# Patient Record
Sex: Female | Born: 1955 | Race: White | Hispanic: No | Marital: Married | Smoking: Current every day smoker
Health system: Southern US, Community
[De-identification: ages and names within clinical notes are randomized; demographics above are authoritative.]

## PROBLEM LIST (undated history)

## (undated) DIAGNOSIS — I10 Essential (primary) hypertension: Secondary | ICD-10-CM

## (undated) DIAGNOSIS — E785 Hyperlipidemia, unspecified: Secondary | ICD-10-CM

## (undated) HISTORY — PX: KIDNEY SURGERY: SHX687

---

## 2004-04-09 ENCOUNTER — Emergency Department (HOSPITAL_COMMUNITY): Admission: EM | Admit: 2004-04-09 | Discharge: 2004-04-09 | Payer: Self-pay | Admitting: Emergency Medicine

## 2005-09-18 IMAGING — CT CT PELVIS W/O CM
1 series · 16 of 32 positions shown, 20 images · non-contrast
Comparison: none

CLINICAL DATA: Left flank pain radiating to left lower quadrant.  Evaluate for possible kidney stone.  
 CT ABDOMEN AND PELVIS WITHOUT CONTRAST
 The unenhanced appearance of the liver, spleen, pancreas and right kidney are all normal.  The left kidney has been removed due to a donor situation.  No adenopathy is seen.  Gallbladder is unremarkable.  Mild diverticula formation could be seen in the transverse colon without diverticulitis.  
 IMPRESSION
 1.  Status post left nephrectomy without adverse features.  
 2.  Diverticulosis without diverticulitis. 
 CT PELVIS WITHOUT CONTRAST 
 Contrast remains from the previous CT two days ago.  There is diverticulosis without diverticulitis.  No free fluid is seen.  There are no pelvic masses.  There is no evidence for obstructive uropathy.  Osseous structures are unremarkable.  
 1.  Residual contrast in the colon demonstrates diverticulosis without inflammatory change to suggest diverticulitis.

[Series 2: routine abdomen · axial · 0.95mm/px · z∈[-496,-166]mm · 16 of 74 slices shown, 20 images]
[im 5/74  soft-tissue]
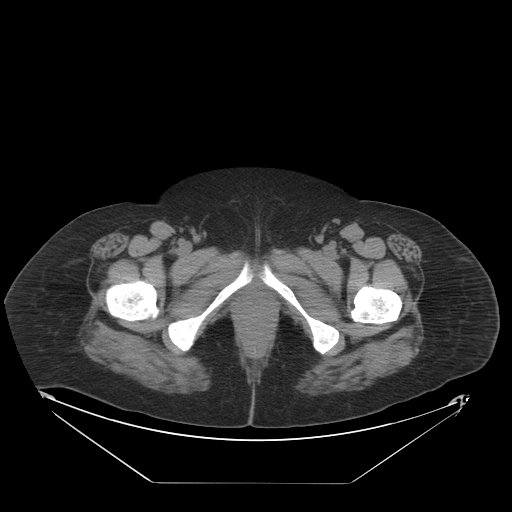
[im 5/74  bone]
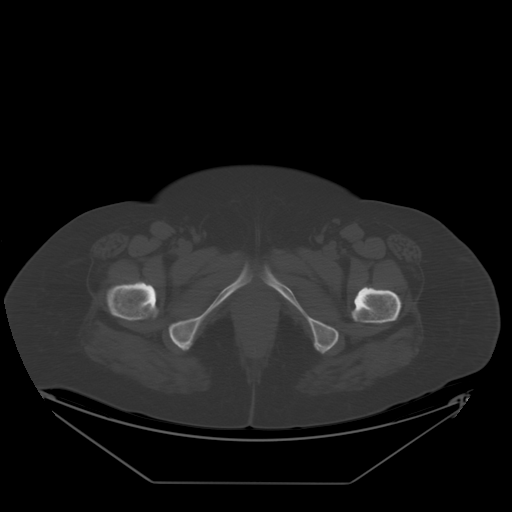
[im 10/74  soft-tissue]
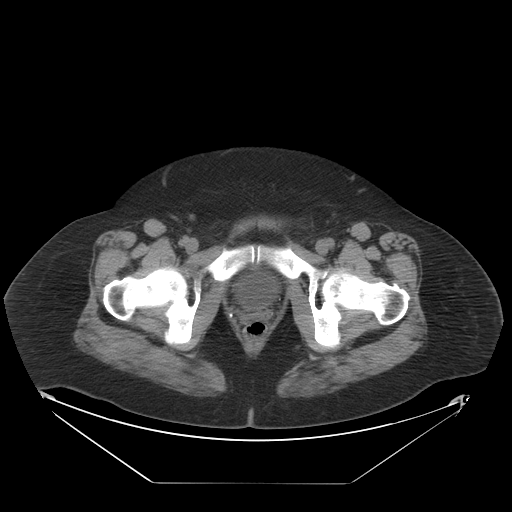
[im 15/74  soft-tissue]
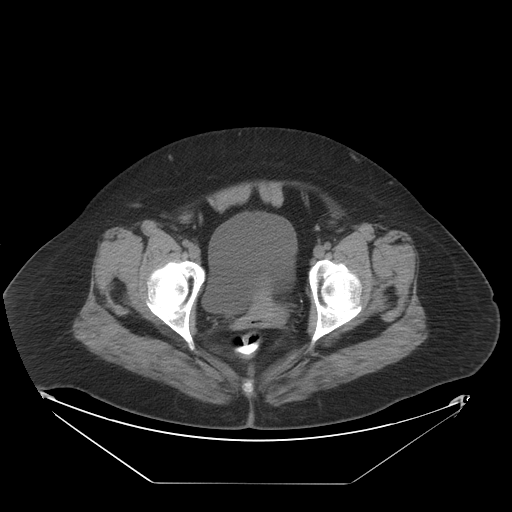
[im 19/74  soft-tissue]
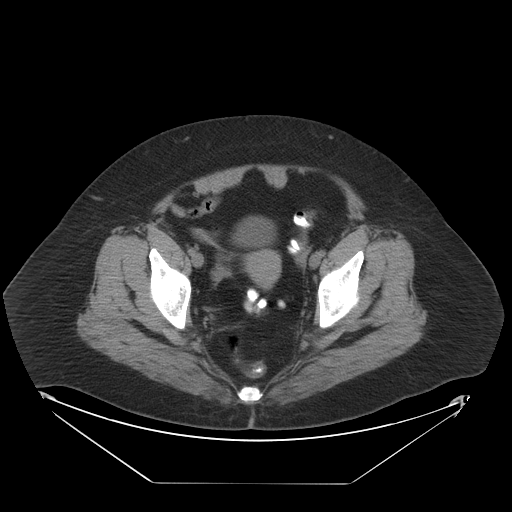
[im 24/74  soft-tissue]
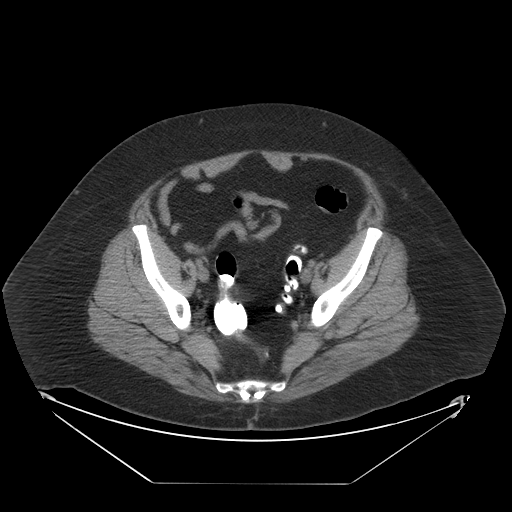
[im 29/74  soft-tissue]
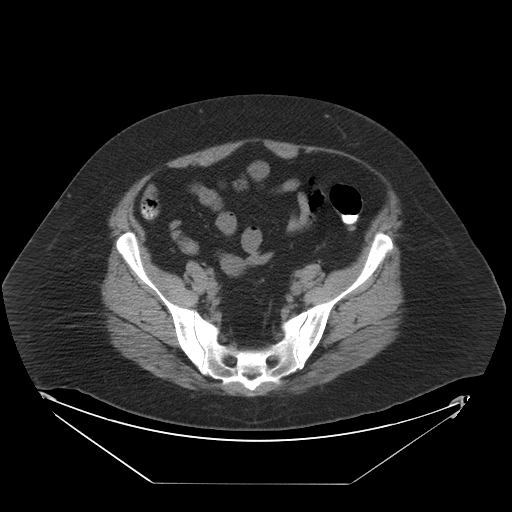
[im 33/74  soft-tissue]
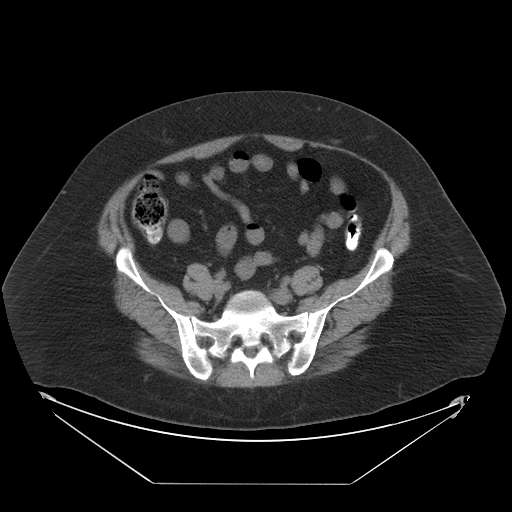
[im 41/74  soft-tissue]
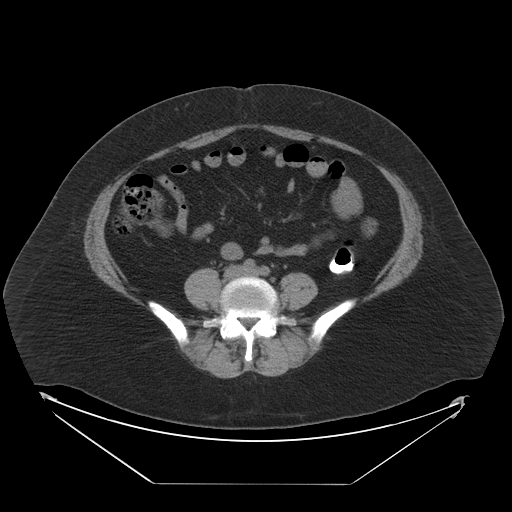
[im 45/74  soft-tissue]
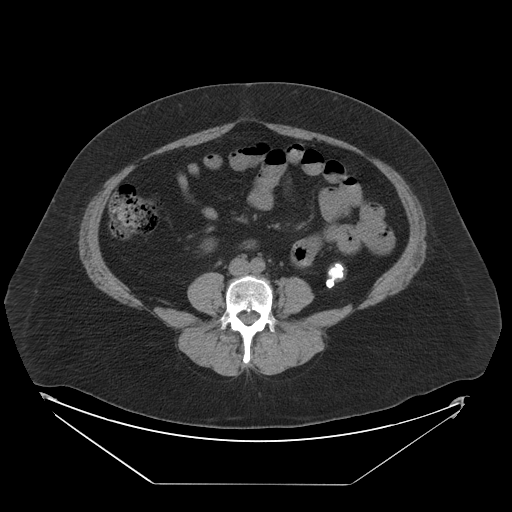
[im 45/74  bone]
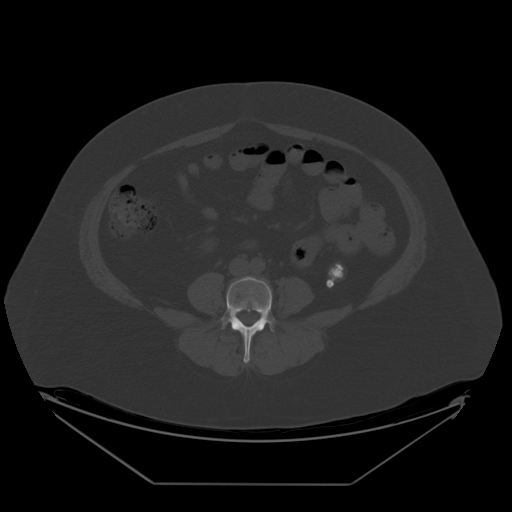
[im 50/74  soft-tissue]
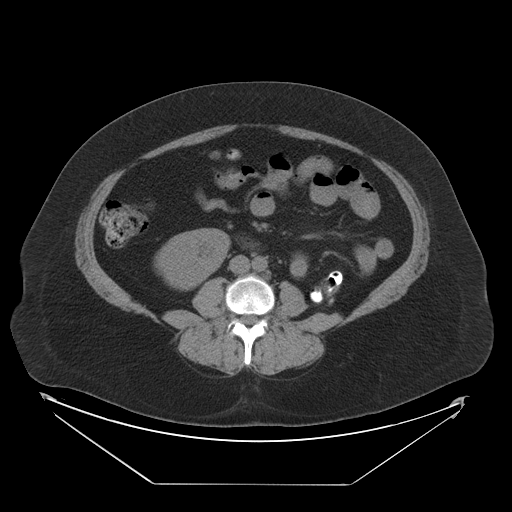
[im 55/74  soft-tissue]
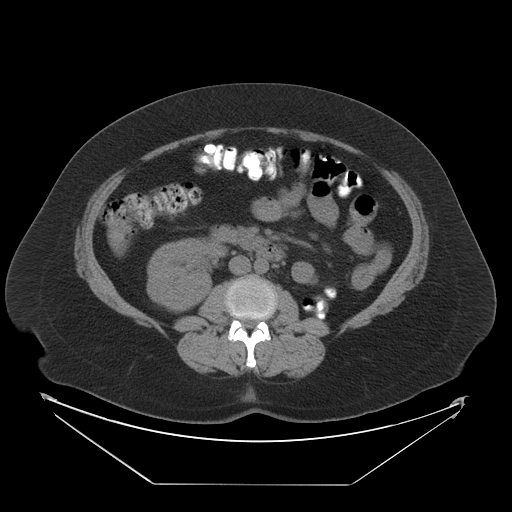
[im 59/74  soft-tissue]
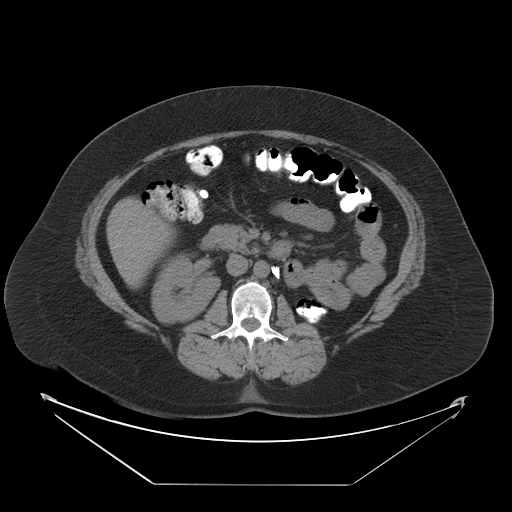
[im 64/74  soft-tissue]
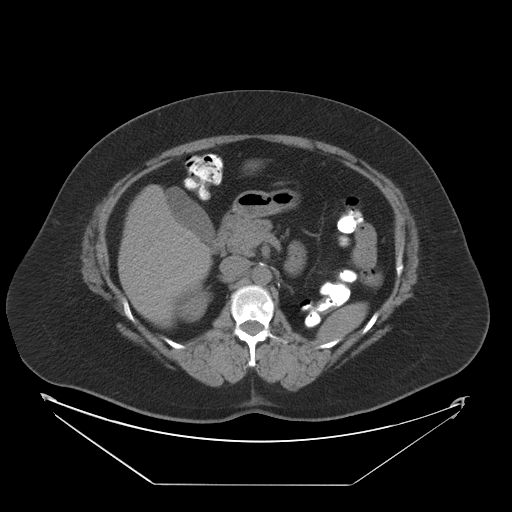
[im 64/74  lung]
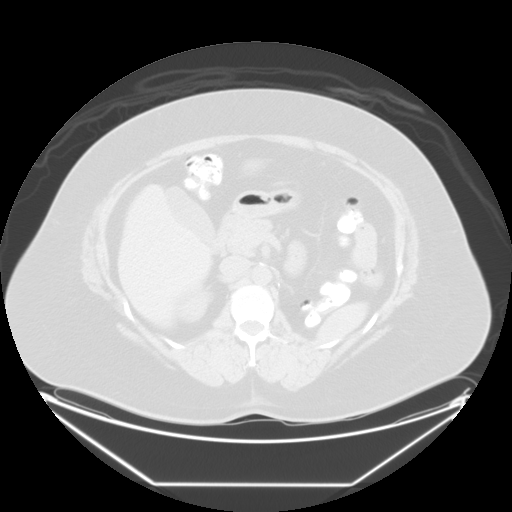
[im 66/74  lung]
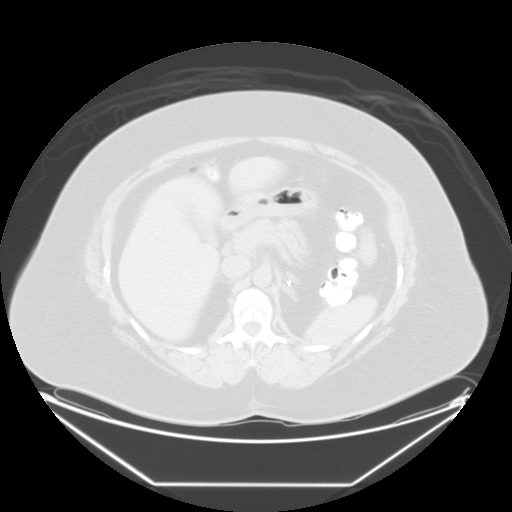
[im 69/74  soft-tissue]
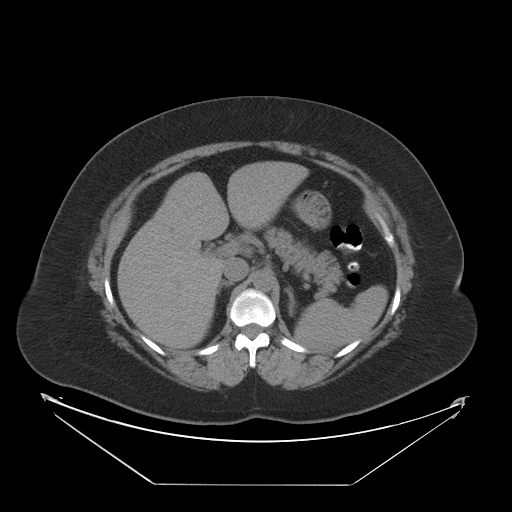
[im 69/74  lung]
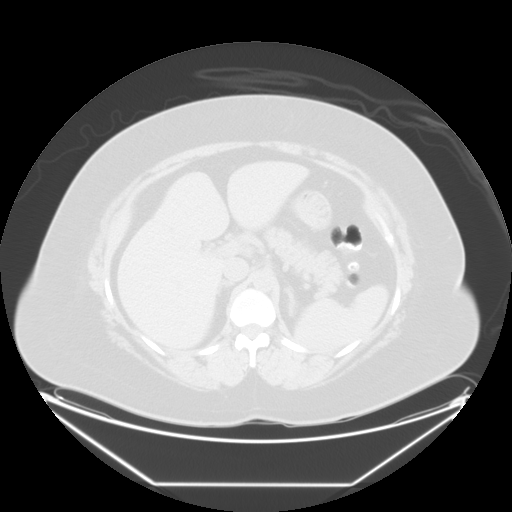
[im 71/74  lung]
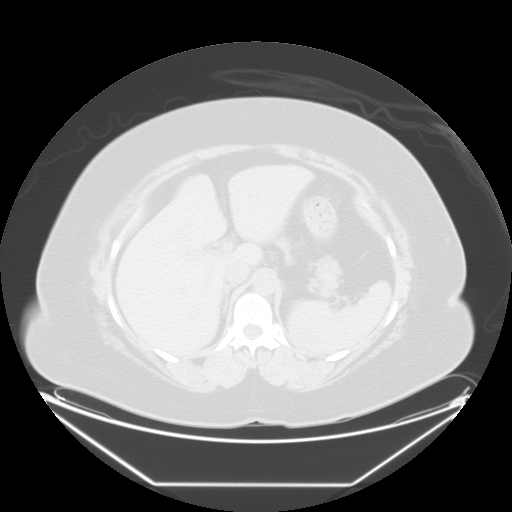

[16 of 32 positions shown; findings below may reference images not displayed]

## 2014-03-24 ENCOUNTER — Telehealth: Payer: Self-pay | Admitting: *Deleted

## 2014-03-24 NOTE — Telephone Encounter (Signed)
When you call me back sometime, I'm at work, I can't get a signal.  But if you would call me back sometime I get a signal.

## 2014-03-25 NOTE — Telephone Encounter (Signed)
CALLED PATIENT AND SHE STATES THAT SHE NEEDS A SHOT IN HER HEEL AND I TOLD HER THAT I WOULD HAVE MELODY CALL AND SET UP A TIME TO COME IN

## 2014-04-24 ENCOUNTER — Ambulatory Visit (INDEPENDENT_AMBULATORY_CARE_PROVIDER_SITE_OTHER): Payer: BC Managed Care – PPO

## 2014-04-24 ENCOUNTER — Ambulatory Visit: Payer: BC Managed Care – PPO

## 2014-04-24 VITALS — BP 170/97 | HR 73 | Resp 18

## 2014-04-24 DIAGNOSIS — M7730 Calcaneal spur, unspecified foot: Secondary | ICD-10-CM

## 2014-04-24 DIAGNOSIS — M79609 Pain in unspecified limb: Secondary | ICD-10-CM

## 2014-04-24 DIAGNOSIS — M722 Plantar fascial fibromatosis: Secondary | ICD-10-CM

## 2014-04-24 DIAGNOSIS — M7732 Calcaneal spur, left foot: Secondary | ICD-10-CM

## 2014-04-24 DIAGNOSIS — M79672 Pain in left foot: Secondary | ICD-10-CM

## 2014-04-24 MED ORDER — MELOXICAM 15 MG PO TABS: 15.0000 mg | ORAL_TABLET | Freq: Every day | ORAL | Status: AC

## 2014-04-24 MED ORDER — TRIAMCINOLONE ACETONIDE 10 MG/ML IJ SUSP: 10.0000 mg | Freq: Once | INTRAMUSCULAR | Status: AC

## 2014-04-24 NOTE — Progress Notes (Signed)
   Subjective:    Patient ID: Ariel Erickson, female    DOB: [birth year], 58 y.o.   MRN: [id number]  HPI MY LEFT HEEL IS BOTHERING ME AND HAS BEEN FOR ABOUT 2 WEEKS AND BURNS AND THROBS AND HURTS IN THE AM AND HURTS AFTER I SIT AND TRY TO GET UP AND SORE AND TENDER AND HURTS ON THE BOTTOM    Review of Systems  All other systems reviewed and are negative.      Objective:   Physical Exam Lower extremity objective findings reveal pedal pulses palpable DP and PT posterior were for capillary refill time 3 seconds epicritic and proprioceptive sensations intact and symmetric bilateral. This is a 58 year old white female well-developed well-nourished female well vital signs stable several years ago was treated for plantar fasciitis is still wearing orthotics however has a flareup in the last couple of weeks. Left heel pain painful symptomatically on palpation of the inferior as well some up and posterior the heel. X-rays demonstrate well-developed inferior some retrocalcaneal spurring and osteophytic changes tenderness on palpation on dorsiflexion of the foot remainder of exam unremarkable rectus structures while fascial thickening on an x-ray skin color pigment and hair growth are normal mild varicosities noted no edema noted      Assessment & Plan:  Assessment plantar fasciitis/heel spur syndrome left foot at this time fascial strapping is applied after injection tender with Kenalog 20 mg Xylocaine plain is infiltrated prescription for MOBIC 15 minutes once daily is 40 to pharmacy reappointed in 3 weeks for followup and reevaluation recommend ice to the heel area and maintain orthoses as instructed next  Alvan Dame DPM

## 2014-04-24 NOTE — Patient Instructions (Signed)

## 2014-05-15 ENCOUNTER — Ambulatory Visit: Payer: BC Managed Care – PPO

## 2021-03-13 ENCOUNTER — Encounter (HOSPITAL_BASED_OUTPATIENT_CLINIC_OR_DEPARTMENT_OTHER): Payer: Self-pay | Admitting: Emergency Medicine

## 2021-03-13 ENCOUNTER — Other Ambulatory Visit: Payer: Self-pay

## 2021-03-13 ENCOUNTER — Emergency Department (HOSPITAL_BASED_OUTPATIENT_CLINIC_OR_DEPARTMENT_OTHER)
Admission: EM | Admit: 2021-03-13 | Discharge: 2021-03-13 | Disposition: A | Discharge status: Home / Self Care | Payer: Medicare Other | Attending: Emergency Medicine | Admitting: Emergency Medicine

## 2021-03-13 DIAGNOSIS — B379 Candidiasis, unspecified: Secondary | ICD-10-CM

## 2021-03-13 DIAGNOSIS — F17200 Nicotine dependence, unspecified, uncomplicated: Secondary | ICD-10-CM | POA: Diagnosis not present

## 2021-03-13 DIAGNOSIS — I10 Essential (primary) hypertension: Secondary | ICD-10-CM | POA: Diagnosis not present

## 2021-03-13 DIAGNOSIS — L292 Pruritus vulvae: Secondary | ICD-10-CM | POA: Diagnosis present

## 2021-03-13 HISTORY — DX: Hyperlipidemia, unspecified: E78.5

## 2021-03-13 HISTORY — DX: Essential (primary) hypertension: I10

## 2021-03-13 LAB — WET PREP, GENITAL
Clue Cells Wet Prep HPF POC: NONE SEEN
Sperm: NONE SEEN
Trich, Wet Prep: NONE SEEN
Yeast Wet Prep HPF POC: NONE SEEN

## 2021-03-13 LAB — URINALYSIS, ROUTINE W REFLEX MICROSCOPIC
Bilirubin Urine: NEGATIVE
Glucose, UA: NEGATIVE mg/dL
Ketones, ur: NEGATIVE mg/dL
Leukocytes,Ua: NEGATIVE
Nitrite: NEGATIVE
Specific Gravity, Urine: 1.03 (ref 1.005–1.030)
pH: 5.5 (ref 5.0–8.0)

## 2021-03-13 MED ORDER — HYDROXYZINE HCL 25 MG PO TABS: 25.0000 mg | ORAL_TABLET | Freq: Three times a day (TID) | ORAL | 0 refills | Status: AC | PRN

## 2021-03-13 MED ORDER — HYDROXYZINE HCL 25 MG PO TABS
25.0000 mg | ORAL_TABLET | Freq: Once | ORAL | Status: AC
  Administered 2021-03-13: 25 mg via ORAL
  Filled 2021-03-13: qty 1

## 2021-03-13 MED ORDER — NYSTATIN-TRIAMCINOLONE 100000-0.1 UNIT/GM-% EX CREA: TOPICAL_CREAM | CUTANEOUS | 0 refills | Status: AC

## 2021-03-13 MED ORDER — NYSTATIN 100000 UNIT/GM EX POWD
Freq: Once | CUTANEOUS | Status: AC
  Filled 2021-03-13: qty 15

## 2021-03-13 MED ORDER — FLUCONAZOLE 150 MG PO TABS
150.0000 mg | ORAL_TABLET | Freq: Once | ORAL | Status: AC
  Administered 2021-03-13: 150 mg via ORAL
  Filled 2021-03-13: qty 1

## 2021-03-13 MED ORDER — FLUCONAZOLE 200 MG PO TABS: 200.0000 mg | ORAL_TABLET | Freq: Once | ORAL | 0 refills | Status: AC

## 2021-03-13 NOTE — ED Notes (Signed)
Pt verbalizes understanding of discharge instructions. Opportunity for questioning and answers were provided. Armand removed by staff, pt discharged from ED to home. Educated to pick up Rx.  

## 2021-03-13 NOTE — ED Triage Notes (Signed)
Patient reports itching to vagina for 2nd day. Md called in diflucan yesterday but symptoms are worse.

## 2021-03-13 NOTE — ED Provider Notes (Signed)
[facility] EMERGENCY DEPT Provider Note   CSN: [id number] Arrival date & time: 03/13/21  1735     History Chief Complaint  Patient presents with   Vaginal Pain    Ariel Erickson is a 65 y.o. female presented emerged department of vaginal itching and irritation for 2 days.  The patient reports it feels like a really bad yeast infection.  She says it is been very pruritic and she has been scratching herself and her inguinal region and vagina are raw.  She reports her PCP did prescribe her Diflucan and she tried to take this at home but the symptoms have not resolved.  She is not diabetic.  She reports some painful urination.  HPI     Past Medical History:  Diagnosis Date   Hyperlipidemia    Hypertension     There are no problems to display for this patient.   Past Surgical History:  Procedure Laterality Date   KIDNEY SURGERY     DONATED A KIDNEY IN 2003     OB History   No obstetric history on file.     No family history on file.  Social History   Tobacco Use   Smoking status: Every Day   Smokeless tobacco: Never  Substance Use Topics   Alcohol use: No   Drug use: No    Home Medications Prior to Admission medications   Medication Sig Start Date End Date Taking? Authorizing Provider  fluconazole (DIFLUCAN) 200 MG tablet Take 1 tablet (200 mg total) by mouth once for 1 dose. 03/16/21 03/16/21 Yes Sherly Brodbeck, Kermit Balo, MD  hydrOXYzine (ATARAX/VISTARIL) 25 MG tablet Take 1 tablet (25 mg total) by mouth every 8 (eight) hours as needed for up to 15 doses. 03/13/21  Yes Terald Sleeper, MD  nystatin-triamcinolone [facility] II) cream Apply to external vagina and labia twice daily for 7 days 03/13/21  Yes Yaretsi Humphres, Kermit Balo, MD  meloxicam (MOBIC) 15 MG tablet Take 1 tablet (15 mg total) by mouth daily. 04/24/14   Alvan Dame, DPM    Allergies    Patient has no known allergies.  Review of Systems   Review of Systems  Constitutional:  Negative for  chills and fever.  Gastrointestinal:  Positive for nausea. Negative for abdominal pain and vomiting.  Genitourinary:  Positive for dysuria and vaginal pain.  Skin:  Negative for color change and rash.  Neurological:  Negative for syncope and light-headedness.  All other systems reviewed and are negative.  Physical Exam Updated Vital Signs BP (!) 147/60   Pulse (!) 56   Temp (!) 97.4 F (36.3 C) (Oral)   Resp 16   SpO2 91%   Physical Exam Constitutional:      General: She is not in acute distress. HENT:     Head: Normocephalic and atraumatic.  Eyes:     Conjunctiva/sclera: Conjunctivae normal.     Pupils: Pupils are equal, round, and reactive to light.  Cardiovascular:     Rate and Rhythm: Normal rate and regular rhythm.  Pulmonary:     Effort: Pulmonary effort is normal. No respiratory distress.  Abdominal:     General: There is no distension.     Tenderness: There is no abdominal tenderness.  Genitourinary:    Comments: Gu exam performed with chaperone present Erythema with mild excoriation of the inguinal region Erythema and mild edema of labia majora and minora Speculum exam showing yeast in vaginal vault, no purulent discharge, no tear or injury to vaginal  vault Skin:    General: Skin is warm and dry.  Neurological:     General: No focal deficit present.     Mental Status: She is alert. Mental status is at baseline.  Psychiatric:        Mood and Affect: Mood normal.        Behavior: Behavior normal.    ED Results / Procedures / Treatments   Labs (all labs ordered are listed, but only abnormal results are displayed) Labs Reviewed  WET PREP, GENITAL - Abnormal; Notable for the following components:      Result Value   WBC, Wet Prep HPF POC FEW (*)    All other components within normal limits  URINALYSIS, ROUTINE W REFLEX MICROSCOPIC - Abnormal; Notable for the following components:   Hgb urine dipstick TRACE (*)    Protein, ur TRACE (*)    All other  components within normal limits    EKG None  Radiology No results found.  Procedures Procedures   Medications Ordered in ED Medications  fluconazole (DIFLUCAN) tablet 150 mg (150 mg Oral Given 03/13/21 2225)  hydrOXYzine (ATARAX/VISTARIL) tablet 25 mg (25 mg Oral Given 03/13/21 2225)  nystatin (MYCOSTATIN/NYSTOP) topical powder ( Topical Given 03/13/21 2225)    ED Course  I have reviewed the triage vital signs and the nursing notes.  Pertinent labs & imaging results that were available during my care of the patient were reviewed by me and considered in my medical decision making (see chart for details).  Patient here with suspected yeast infection.  Clinically on exam this does appear consistent.  She has a moderate degree of inflammation of the vagina and labia which is giving her considerable discomfort.  I advised a steroid/nystatin cream for external application, another dose of diflucan (more severe yeast infections may require 3 doses - another prescription provided to use 3 days from now), and will add visteril to her home benadryl for itching and discomfort.   No evidence of cellulitis or skin infection Doubt STI or PID Wet prep without sign of BV UA without obvious sign of infection.    Final Clinical Impression(s) / ED Diagnoses Final diagnoses:  Yeast infection    Rx / DC Orders ED Discharge Orders          Ordered    fluconazole (DIFLUCAN) 200 MG tablet   Once        03/13/21 2228    nystatin-triamcinolone (MYCOLOG II) cream        03/13/21 2228    hydrOXYzine (ATARAX/VISTARIL) 25 MG tablet  Every 8 hours PRN        03/13/21 2228             Terald Sleeper, MD 03/14/21 1130
